# Patient Record
Sex: Male | Born: 1960 | Race: White | Hispanic: No | Smoking: Never smoker
Health system: Southern US, Community
[De-identification: ages and names within clinical notes are randomized; demographics above are authoritative.]

## PROBLEM LIST (undated history)

## (undated) DIAGNOSIS — L853 Xerosis cutis: Secondary | ICD-10-CM

## (undated) HISTORY — PX: OTHER SURGICAL HISTORY: SHX169

## (undated) HISTORY — PX: ANKLE SURGERY: SHX546

## (undated) HISTORY — PX: TONSILLECTOMY: SUR1361

---

## 2009-07-14 ENCOUNTER — Ambulatory Visit: Payer: Self-pay | Admitting: Family Medicine

## 2009-07-14 DIAGNOSIS — J019 Acute sinusitis, unspecified: Secondary | ICD-10-CM

## 2009-07-14 LAB — CONVERTED CEMR LAB: Rapid Strep: NEGATIVE

## 2010-04-20 ENCOUNTER — Ambulatory Visit: Payer: Self-pay | Admitting: Emergency Medicine

## 2010-04-20 DIAGNOSIS — L259 Unspecified contact dermatitis, unspecified cause: Secondary | ICD-10-CM | POA: Insufficient documentation

## 2010-04-29 ENCOUNTER — Telehealth (INDEPENDENT_AMBULATORY_CARE_PROVIDER_SITE_OTHER): Payer: Self-pay | Admitting: *Deleted

## 2010-09-24 NOTE — Assessment & Plan Note (Signed)
Summary: RASH ON FOOT/TM   Vital Signs:  Patient Profile:   50 Years Old Male CC:      Rash on top of right foot x 2 months Height:     72 inches Weight:      198 pounds O2 Sat:      98 % O2 treatment:    Room Air Temp:     97.5 degrees F oral Pulse rate:   46 / minute Pulse rhythm:   regular Resp:     16 per minute BP sitting:   118 / 79  (left arm) Cuff size:   regular  Vitals Entered By: Emilio Math (April 20, 2010 10:34 AM)                  Current Allergies (reviewed today): ! PENICILLINHistory of Present Illness Chief Complaint: Rash on top of right foot x 2 months History of Present Illness: Rash on top of R foot x2 months. However, is getting worse recently. Itchy.  He thinks it began when he was mowing his yard in tennis shoes without socks.  Became red.  Since then has come and gone, itchy, red, now with spots and irritation.  He tried to scrub it and use antifungal spray, but he thinks that's what made it worse.  Current Meds ADVIL 200 MG TABS (IBUPROFEN) As directed ZYRTEC-D ALLERGY & CONGESTION 5-120 MG XR12H-TAB (CETIRIZINE-PSEUDOEPHEDRINE)  CLOBETASOL PROPIONATE 0.05 % CREA (CLOBETASOL PROPIONATE) apply to affected area two times a day  REVIEW OF SYSTEMS Constitutional Symptoms      Denies fever, chills, night sweats, weight loss, weight gain, and fatigue.  Eyes       Denies change in vision, eye pain, eye discharge, glasses, contact lenses, and eye surgery. Ear/Nose/Throat/Mouth       Denies hearing loss/aids, change in hearing, ear pain, ear discharge, dizziness, frequent runny nose, frequent nose bleeds, sinus problems, sore throat, hoarseness, and tooth pain or bleeding.  Respiratory       Denies dry cough, productive cough, wheezing, shortness of breath, asthma, bronchitis, and emphysema/COPD.  Cardiovascular       Denies murmurs, chest pain, and tires easily with exhertion.    Gastrointestinal       Denies stomach pain, nausea/vomiting,  diarrhea, constipation, blood in bowel movements, and indigestion. Genitourniary       Denies painful urination, kidney stones, and loss of urinary control. Neurological       Denies paralysis, seizures, and fainting/blackouts. Musculoskeletal       Denies muscle pain, joint pain, joint stiffness, decreased range of motion, redness, swelling, muscle weakness, and gout.  Skin       Denies bruising, unusual mles/lumps or sores, and hair/skin or nail changes.  Psych       Denies mood changes, temper/anger issues, anxiety/stress, speech problems, depression, and sleep problems.  Past History:  Past Medical History: Reviewed history from 07/14/2009 and no changes required. Unremarkable  Past Surgical History: Reviewed history from 07/14/2009 and no changes required. Tonsillectomy Broken ankle - Rod inserted 1996                        Rod removed 1998  Family History: Reviewed history from 07/14/2009 and no changes required. Family History of Prostate CA 1st degree relative <50 - Father Family History of Cardiovascular disorder MI - Grandfathers and Uncles  Social History: Reviewed history from 07/14/2009 and no changes required. Single Never Smoked Alcohol use-no Drug use-no  Regular exercise-yes Truck driver Physical Exam General appearance: well developed, well nourished, no acute distress Dorsum of R foot, area 2x3cm with lesion c/w contact derm with irritation, redness, dryness. There are small spots almost petechiae-like, but seems to be more chronic irritation. Assessment New Problems: CONTACT DERMATITIS (ICD-692.9)  Contact derm, likely from repeated exposure (poison ivy oils in his shoe?)  Patient Education: Patient and/or caregiver instructed in the following: fluids.  Plan New Medications/Changes: CLOBETASOL PROPIONATE 0.05 % CREA (CLOBETASOL PROPIONATE) apply to affected area two times a day  #100g x 0, 04/20/2010, Hoyt Koch MD  New Orders: Est.  Patient Level II [phone] Planning Comments:   Don't scratch or rub on foot and let it heal up.  Use cream as directed.  If not improving in 1-2 weeks, will need to f/u with a dermatologist.   The patient and/or caregiver has been counseled thoroughly with regard to medications prescribed including dosage, schedule, interactions, rationale for use, and possible side effects and they verbalize understanding.  Diagnoses and expected course of recovery discussed and will return if not improved as expected or if the condition worsens. Patient and/or caregiver verbalized understanding.  Prescriptions: CLOBETASOL PROPIONATE 0.05 % CREA (CLOBETASOL PROPIONATE) apply to affected area two times a day  #100g x 0   Entered and Authorized by:   Hoyt Koch MD   Signed by:   Hoyt Koch MD on 04/20/2010   Method used:   Print then Give to Patient   RxID:   [id number]   Orders Added: 1)  Est. Patient Level II [95621]

## 2010-09-24 NOTE — Progress Notes (Signed)
  Phone Note Outgoing Call Call back at [facility] Phone [phone]   Call placed by: Lajean Saver RN,  April 29, 2010 4:54 PM Call placed to: Patient Action Taken: Phone Call Completed Summary of Call: Call back: Patient states rash is improving and he is feeling well. Reminded if rash gets worseor returns to see a Armed forces operational officer.

## 2011-07-05 ENCOUNTER — Emergency Department
Admission: EM | Admit: 2011-07-05 | Discharge: 2011-07-05 | Disposition: A | Discharge status: Home / Self Care | Payer: BC Managed Care – PPO | Source: Home / Self Care | Attending: Family Medicine | Admitting: Family Medicine

## 2011-07-05 ENCOUNTER — Encounter: Payer: Self-pay | Admitting: Emergency Medicine

## 2011-07-05 DIAGNOSIS — L851 Acquired keratosis [keratoderma] palmaris et plantaris: Secondary | ICD-10-CM

## 2011-07-05 DIAGNOSIS — L858 Other specified epidermal thickening: Secondary | ICD-10-CM

## 2011-07-05 HISTORY — DX: Xerosis cutis: L85.3

## 2011-07-05 MED ORDER — TRIAMCINOLONE ACETONIDE 0.1 % EX CREA: TOPICAL_CREAM | Freq: Two times a day (BID) | CUTANEOUS | Status: AC

## 2011-07-05 NOTE — ED Notes (Signed)
Rash on extremeties x 2-3 weeks. Similar to dry skin rash in past but not responding to Amylactic cream.

## 2011-07-08 NOTE — ED Provider Notes (Signed)
History     CSN: [id number] Arrival date & time: 07/05/2011 10:26 AM   First MD Initiated Contact with Patient 07/05/11 1026      Chief Complaint  Patient presents with  . Rash    2-3 weeks; similar to previous dry skin rashes but not responding to OTC      HPI Comments: Patient has a history of a recurring rough rash on his upper arms that usually occurs in dry weather and change of seasons.  The rash has responded in the past to lactic acid cream.  Over the past week or two the rash on upper arms/shoulders has improved with the lactic acid cream, but he has now developed mild erythema and itching on his elbows.  Patient is a 50 y.o. male presenting with rash. The history is provided by the patient.  Rash  This is a recurrent problem. The current episode started more than 1 week ago. The problem has been gradually worsening. Associated with: Dry air and change of seasons. There has been no fever. The rash is present on the left arm and right arm. Associated symptoms include itching. Pertinent negatives include no blisters, no pain and no weeping. Treatments tried: Lactic acid cream. The treatment provided mild relief.    Past Medical History  Diagnosis Date  . Dry skin dermatitis     Past Surgical History  Procedure Date  . Tonsillectomy   . Broken right ankle rod     Family History  Problem Relation Age of Onset  . Hypertension Father   . Hyperlipidemia Father     History  Substance Use Topics  . Smoking status: Not on file  . Smokeless tobacco: Not on file  . Alcohol Use: No      Review of Systems  Constitutional: Negative.   HENT: Negative.   Eyes: Negative.   Respiratory: Negative.   Cardiovascular: Negative.   Gastrointestinal: Negative.   Genitourinary: Negative.   Musculoskeletal: Negative.   Skin: Positive for itching and rash.  Neurological: Negative.   Hematological: Negative.     Allergies  Penicillins  Home Medications   Current  Outpatient Rx  Name Route Sig Dispense Refill  . AMMONIUM LACTATE 12 % EX CREA Topical Apply topically as needed.      . TRIAMCINOLONE ACETONIDE 0.1 % EX CREA Topical Apply topically 2 (two) times daily. 30 g 1    BP 118/78  Pulse 46  Temp(Src) 98.9 F (37.2 C) (Oral)  Resp 16  Ht 6' (1.829 m)  Wt 189 lb (85.73 kg)  BMI 25.63 kg/m2  SpO2 99%  Physical Exam  Nursing note and vitals reviewed. Constitutional: He is oriented to person, place, and time. He appears well-developed and well-nourished. No distress.  HENT:  Head: Normocephalic and atraumatic.  Mouth/Throat: Oropharynx is clear and moist.  Eyes: Conjunctivae are normal. Pupils are equal, round, and reactive to light.  Neck: Neck supple.  Cardiovascular: Regular rhythm and normal heart sounds.   Pulmonary/Chest: Effort normal and breath sounds normal. No respiratory distress. He has no wheezes. He has no rales.  Abdominal: Soft. There is no tenderness.  Musculoskeletal: He exhibits no edema.  Lymphadenopathy:    He has no cervical adenopathy.  Neurological: He is alert and oriented to person, place, and time.  Skin: Skin is warm and dry. Rash noted. Rash is macular. Rash is not papular.          Bilateral antecubital fossae reveal mild macular erythema without swelling or erythema.  Upper arms over deltoid areas reveal follicular plugging without erythema, and skin has a "sandpaper" texture.  Psychiatric: He has a normal mood and affect.    ED Course  Procedures None      1. Keratosis pilaris, acquired       MDM  Begin Kenalog cream to antecubital fossae bid for about 5 to 7 days until erythema and irritation resolved, then discontinue. May continue ammonium lactate cream Discussed proper skin care:  Minimize baths/showers.  Use a mild bath soap containing oil such as unscented Dove.  Apply a moisturizing cream or lotion immediately after bathing while still wet, then towel dry.  Given [facility]  information and instructions on topic Keratosis Pilaris Follow-up with dermatologist 2 weeks if not improving or if symptoms become worse.            Donna Christen, MD 07/08/11 [phone]

## 2013-01-01 ENCOUNTER — Emergency Department
Admission: EM | Admit: 2013-01-01 | Discharge: 2013-01-01 | Disposition: A | Discharge status: Home / Self Care | Payer: BC Managed Care – PPO | Source: Home / Self Care | Attending: Family Medicine | Admitting: Family Medicine

## 2013-01-01 ENCOUNTER — Ambulatory Visit (HOSPITAL_BASED_OUTPATIENT_CLINIC_OR_DEPARTMENT_OTHER)
Admit: 2013-01-01 | Discharge: 2013-01-01 | Disposition: A | Discharge status: Home / Self Care | Payer: BC Managed Care – PPO | Attending: Family Medicine | Admitting: Family Medicine

## 2013-01-01 ENCOUNTER — Encounter: Payer: Self-pay | Admitting: Emergency Medicine

## 2013-01-01 DIAGNOSIS — R319 Hematuria, unspecified: Secondary | ICD-10-CM | POA: Insufficient documentation

## 2013-01-01 DIAGNOSIS — R109 Unspecified abdominal pain: Secondary | ICD-10-CM

## 2013-01-01 LAB — POCT URINALYSIS DIP (MANUAL ENTRY)
Bilirubin, UA: NEGATIVE
Glucose, UA: NEGATIVE
Ketones, POC UA: NEGATIVE
Nitrite, UA: NEGATIVE
Spec Grav, UA: 1.01 (ref 1.005–1.03)

## 2013-01-01 MED ORDER — MELOXICAM 15 MG PO TABS: 15.0000 mg | ORAL_TABLET | Freq: Every day | ORAL | Status: DC

## 2013-01-01 NOTE — ED Notes (Signed)
Reports abdominal pain x3-4 days.

## 2013-01-02 LAB — URINE CULTURE: Organism ID, Bacteria: NO GROWTH

## 2013-01-03 ENCOUNTER — Telehealth: Payer: Self-pay | Admitting: *Deleted

## 2013-01-05 NOTE — ED Provider Notes (Signed)
History     CSN: [id number]  Arrival date & time 01/01/13  1704   First MD Initiated Contact with Patient 01/01/13 1800      Chief Complaint  Patient presents with  . Abdominal Pain   HPI  R groin pain x 3-4 days.  Mild pain that radiated around to groin.  Mild pain with movement.  No hematuria.  Mild polyuria.  No dysuria.  No prior hx/o kidney stones.  Unsure of any recent strenuous activity.    Past Medical History  Diagnosis Date  . Dry skin dermatitis     Past Surgical History  Procedure Laterality Date  . Tonsillectomy    . Broken right ankle rod      Family History  Problem Relation Age of Onset  . Hypertension Father   . Hyperlipidemia Father     History  Substance Use Topics  . Smoking status: Not on file  . Smokeless tobacco: Not on file  . Alcohol Use: No      Review of Systems  All other systems reviewed and are negative.    Allergies  Penicillins  Home Medications   Current Outpatient Rx  Name  Route  Sig  Dispense  Refill  . ammonium lactate (AMLACTIN) 12 % cream   Topical   Apply topically as needed.           . meloxicam (MOBIC) 15 MG tablet   Oral   Take 1 tablet (15 mg total) by mouth daily.   30 tablet   1     BP 132/88  Pulse 69  Temp(Src) 98.1 F (36.7 C) (Oral)  Resp 16  Ht 6' (1.829 m)  Wt 200 lb (90.719 kg)  BMI 27.12 kg/m2  SpO2 100%  Physical Exam  Constitutional: He is oriented to person, place, and time. He appears well-developed and well-nourished.  HENT:  Head: Normocephalic and atraumatic.  Eyes: Conjunctivae are normal. Pupils are equal, round, and reactive to light.  Neck: Normal range of motion. Neck supple.  Cardiovascular: Normal rate, regular rhythm and normal heart sounds.   Pulmonary/Chest: Effort normal.  Abdominal: Soft. Bowel sounds are normal.  Mild R sided flank pain  No suprapubic tenderness   Genitourinary:  No hernia  Musculoskeletal: Normal range of motion.   Neurological: He is alert and oriented to person, place, and time.  Skin: Skin is warm.    ED Course  Procedures (including critical care time)  Labs Reviewed  URINE CULTURE   Narrative:    Performed at:  [facility]                [address], [address]                [place], [place] [address]  POCT URINALYSIS DIP (MANUAL ENTRY)   ABDOMEN - 1 VIEW  Comparison: None  Findings:  Small pelvic phleboliths.  Normal bowel gas pattern.  No definite urinary tract calcification identified.  Osseous structures unremarkable.  IMPRESSION:  No definite urinary tract calcification identified.    1. Right flank pain       MDM  UA negative for hematuria No kidney stones on KUB.  Will check urine culture.  ? MSK component.  Will place on mobic for symptomatic management pending urine culture.  Discussed MSK and GU red flags Follow up as needed.     The patient and/or caregiver has been counseled thoroughly with regard to treatment plan and/or medications prescribed including dosage,  schedule, interactions, rationale for use, and possible side effects and they verbalize understanding. Diagnoses and expected course of recovery discussed and will return if not improved as expected or if the condition worsens. Patient and/or caregiver verbalized understanding.             Doree Albee, MD 01/05/13 2108

## 2014-01-30 ENCOUNTER — Encounter (HOSPITAL_BASED_OUTPATIENT_CLINIC_OR_DEPARTMENT_OTHER): Payer: Self-pay | Admitting: Emergency Medicine

## 2014-01-30 ENCOUNTER — Emergency Department (HOSPITAL_BASED_OUTPATIENT_CLINIC_OR_DEPARTMENT_OTHER)
Admission: EM | Admit: 2014-01-30 | Discharge: 2014-01-31 | Disposition: A | Discharge status: Home / Self Care | Payer: Worker's Compensation | Attending: Emergency Medicine | Admitting: Emergency Medicine

## 2014-01-30 ENCOUNTER — Emergency Department (HOSPITAL_BASED_OUTPATIENT_CLINIC_OR_DEPARTMENT_OTHER): Payer: Worker's Compensation

## 2014-01-30 DIAGNOSIS — Z872 Personal history of diseases of the skin and subcutaneous tissue: Secondary | ICD-10-CM | POA: Insufficient documentation

## 2014-01-30 DIAGNOSIS — Z791 Long term (current) use of non-steroidal anti-inflammatories (NSAID): Secondary | ICD-10-CM | POA: Insufficient documentation

## 2014-01-30 DIAGNOSIS — S5002XA Contusion of left elbow, initial encounter: Secondary | ICD-10-CM

## 2014-01-30 DIAGNOSIS — Y929 Unspecified place or not applicable: Secondary | ICD-10-CM | POA: Insufficient documentation

## 2014-01-30 DIAGNOSIS — W010XXA Fall on same level from slipping, tripping and stumbling without subsequent striking against object, initial encounter: Secondary | ICD-10-CM | POA: Insufficient documentation

## 2014-01-30 DIAGNOSIS — S51009A Unspecified open wound of unspecified elbow, initial encounter: Secondary | ICD-10-CM | POA: Insufficient documentation

## 2014-01-30 DIAGNOSIS — S51012A Laceration without foreign body of left elbow, initial encounter: Secondary | ICD-10-CM

## 2014-01-30 DIAGNOSIS — Z23 Encounter for immunization: Secondary | ICD-10-CM | POA: Insufficient documentation

## 2014-01-30 DIAGNOSIS — Y9389 Activity, other specified: Secondary | ICD-10-CM | POA: Insufficient documentation

## 2014-01-30 DIAGNOSIS — S5000XA Contusion of unspecified elbow, initial encounter: Secondary | ICD-10-CM | POA: Insufficient documentation

## 2014-01-30 DIAGNOSIS — Z880 Allergy status to penicillin: Secondary | ICD-10-CM | POA: Insufficient documentation

## 2014-01-30 MED ORDER — IBUPROFEN 800 MG PO TABS
800.0000 mg | ORAL_TABLET | Freq: Once | ORAL | Status: AC
  Administered 2014-01-30: 800 mg via ORAL
  Filled 2014-01-30: qty 1

## 2014-01-30 MED ORDER — BACITRACIN 500 UNIT/GM EX OINT
1.0000 "application " | TOPICAL_OINTMENT | Freq: Two times a day (BID) | CUTANEOUS | Status: DC
  Administered 2014-01-30: 1 via TOPICAL
  Filled 2014-01-30: qty 0.9

## 2014-01-30 MED ORDER — LIDOCAINE HCL (PF) 1 % IJ SOLN: 5.0000 mL | Freq: Once | INTRAMUSCULAR | Status: DC

## 2014-01-30 MED ORDER — TETANUS-DIPHTH-ACELL PERTUSSIS 5-2.5-18.5 LF-MCG/0.5 IM SUSP
0.5000 mL | Freq: Once | INTRAMUSCULAR | Status: AC
  Administered 2014-01-30: 0.5 mL via INTRAMUSCULAR
  Filled 2014-01-30: qty 0.5

## 2014-01-30 NOTE — ED Notes (Signed)
Left elbow injury. Laceration and blunt trauma. Was getting out of his [employer] truck in the rain and slipped on a wet step.

## 2014-01-30 NOTE — Discharge Instructions (Signed)
Return for any redness, fever, increased pain or signs of infection.  Contusion A contusion is a deep bruise. Contusions are the result of an injury that caused bleeding under the skin. The contusion may turn blue, purple, or yellow. Minor injuries will give you a painless contusion, but more severe contusions may stay painful and swollen for a few weeks.  CAUSES  A contusion is usually caused by a blow, trauma, or direct force to an area of the body. SYMPTOMS   Swelling and redness of the injured area.  Bruising of the injured area.  Tenderness and soreness of the injured area.  Pain. DIAGNOSIS  The diagnosis can be made by taking a history and physical exam. An X-ray, CT scan, or MRI may be needed to determine if there were any associated injuries, such as fractures. TREATMENT  Specific treatment will depend on what area of the body was injured. In general, the best treatment for a contusion is resting, icing, elevating, and applying cold compresses to the injured area. Over-the-counter medicines may also be recommended for pain control. Ask your caregiver what the best treatment is for your contusion. HOME CARE INSTRUCTIONS   Put ice on the injured area.  Put ice in a plastic bag.  Place a towel between your skin and the bag.  Leave the ice on for 15-20 minutes, 03-04 times a day.  Only take over-the-counter or prescription medicines for pain, discomfort, or fever as directed by your caregiver. Your caregiver may recommend avoiding anti-inflammatory medicines (aspirin, ibuprofen, and naproxen) for 48 hours because these medicines may increase bruising.  Rest the injured area.  If possible, elevate the injured area to reduce swelling. SEEK IMMEDIATE MEDICAL CARE IF:   You have increased bruising or swelling.  You have pain that is getting worse.  Your swelling or pain is not relieved with medicines. MAKE SURE YOU:   Understand these instructions.  Will watch your  condition.  Will get help right away if you are not doing well or get worse. Document Released: 05/21/2005 Document Revised: 11/03/2011 Document Reviewed: 06/16/2011 [facility] Patient Information 2014 [place], [place].

## 2014-01-30 NOTE — ED Provider Notes (Signed)
CSN: [id number]     Arrival date & time 01/30/14  2038 History   First MD Initiated Contact with Patient 01/30/14 2207     Chief Complaint  Patient presents with  . Elbow Injury     (Consider location/radiation/quality/duration/timing/severity/associated sxs/prior Treatment) Patient is a 53 y.o. male presenting with arm injury. The history is provided by the patient.  Arm Injury Location:  Elbow Time since incident:  1 hour Injury: yes   Mechanism of injury: fall   Fall:    Impact surface:  Concrete   Entrapped after fall: no   Elbow location:  L elbow Pain details:    Quality:  Throbbing   Radiates to:  Does not radiate   Severity:  Moderate   Onset quality:  Sudden   Timing:  Constant   Progression:  Unchanged Chronicity:  New Dislocation: no   Foreign body present:  Unable to specify Tetanus status:  Out of date Prior injury to area:  No Relieved by:  None tried Worsened by:  Nothing tried Ineffective treatments:  None tried  Larry Prince is a 53 y.o. male who presents to the ED with left elbow pain. He was getting out of his truck and it was raining. His foot slipped and he fell out of the truck onto the pavement. He hit on his buttocks and his left elbow. He denies pain at this time in the buttocks but does have pain in the left elbow and laceration. He denies any other injuries.   Past Medical History  Diagnosis Date  . Dry skin dermatitis    Past Surgical History  Procedure Laterality Date  . Tonsillectomy    . Broken right ankle rod     Family History  Problem Relation Age of Onset  . Hypertension Father   . Hyperlipidemia Father    History  Substance Use Topics  . Smoking status: Never Smoker   . Smokeless tobacco: Not on file  . Alcohol Use: No    Review of Systems Negative except as stated in HPI   Allergies  Penicillins  Home Medications   Prior to Admission medications   Medication Sig Start Date End Date Taking? Authorizing Provider    ammonium lactate (AMLACTIN) 12 % cream Apply topically as needed.      Historical Provider, MD  meloxicam (MOBIC) 15 MG tablet Take 1 tablet (15 mg total) by mouth daily. 01/01/13   Doree AlbeeSteven Newton, MD   BP 123/92  Pulse 56  Temp(Src) 97.6 F (36.4 C) (Oral)  Resp 18  Ht 6' (1.829 m)  Wt 197 lb (89.359 kg)  BMI 26.71 kg/m2  SpO2 100% Physical Exam  Nursing note and vitals reviewed. Constitutional: He is oriented to person, place, and time. He appears well-developed and well-nourished. No distress.  HENT:  Head: Normocephalic.  Eyes: EOM are normal.  Neck: Normal range of motion. Neck supple.  Cardiovascular: Normal rate.   Pulmonary/Chest: Effort normal.  Musculoskeletal: Normal range of motion.       Left elbow: He exhibits swelling and laceration. He exhibits normal range of motion and no deformity. Tenderness found. Radial head tenderness noted.       Arms: There is a flap laceration to the left elbow. Bleeding controlled.   Neurological: He is alert and oriented to person, place, and time. No cranial nerve deficit.  Skin: Skin is warm and dry.  Psychiatric: He has a normal mood and affect. His behavior is normal.    ED Course  Procedures (including critical care time) LACERATION REPAIR Performed by: Larry Prince Authorized by: Larry Prince Consent: Verbal consent obtained. Risks and benefits: risks, benefits and alternatives were discussed Consent given by: patient Patient identity confirmed: provided demographic data Prepped and Draped in normal sterile fashion Wound explored  Laceration Location: left elbow  Laceration Length: 1.5 cm  Cleaned with betadine and irrigated with NSS  No Foreign Bodies seen or palpated  Anesthesia: local infiltration  Local anesthetic: lidocaine 2% without epinephrine  Anesthetic total: 2 ml  Irrigation method: syringe Amount of cleaning: standard  Skin closure: 5-0 prolene  Number of sutures: 3  Technique:  interrupted  Patient tolerance: Patient tolerated the procedure well with no immediate complications.   Imaging Review Dg Elbow Complete Left  01/30/2014   CLINICAL DATA:  Elbow injury. Pain and laceration. Fall from truck.  EXAM: LEFT ELBOW - COMPLETE 3+ VIEW  COMPARISON:  None.  FINDINGS: There is no evidence of fracture, dislocation, or joint effusion. There is no evidence of arthropathy or other focal bone abnormality. Soft tissues are unremarkable.  IMPRESSION: Negative.   Electronically Signed   By: Britta Mccreedy M.D.   On: 01/30/2014 21:21   MDM  53 y.o. male with laceration and contusion to the left elbow s/p fall. I have reviewed this patient's vital signs, nurses notes, appropriate labs and imaging.  I have discussed findings and plan of care with the patient and he voices understanding. He will follow up in one week for suture removal. He will return sooner for any problems. 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 He will take ibuprofen as needed. He will apply ice to the area and elevate.     [address] [place], [place] 01/31/14 [phone]

## 2014-02-02 NOTE — ED Provider Notes (Signed)
Medical screening examination/treatment/procedure(s) were performed by non-physician practitioner and as supervising physician I was immediately available for consultation/collaboration.   EKG Interpretation None        Kristen N Ward, DO 02/02/14 0804 

## 2014-02-06 ENCOUNTER — Emergency Department (INDEPENDENT_AMBULATORY_CARE_PROVIDER_SITE_OTHER)
Admission: EM | Admit: 2014-02-06 | Discharge: 2014-02-06 | Disposition: A | Discharge status: Home / Self Care | Payer: Worker's Compensation | Source: Home / Self Care

## 2014-02-06 ENCOUNTER — Encounter: Payer: Self-pay | Admitting: Emergency Medicine

## 2014-02-06 DIAGNOSIS — Z4802 Encounter for removal of sutures: Secondary | ICD-10-CM

## 2014-02-06 NOTE — ED Notes (Signed)
Removed 2 sutures

## 2014-02-07 NOTE — Discharge Instructions (Signed)
Suture Removal, Care After Refer to this sheet in the next few weeks. These instructions provide you with information on caring for yourself after your procedure. Your health care provider may also give you more specific instructions. Your treatment has been planned according to current medical practices, but problems sometimes occur. Call your health care provider if you have any problems or questions after your procedure. WHAT TO EXPECT AFTER THE PROCEDURE After your stitches (sutures) are removed, it is typical to have the following:  Some discomfort and swelling in the wound area.  Slight redness in the area. HOME CARE INSTRUCTIONS   If you have skin adhesive strips over the wound area, do not take the strips off. They will fall off on their own in a few days. If the strips remain in place after 14 days, you may remove them.  Change any bandages (dressings) at least once a day or as directed by your health care provider. If the bandage sticks, soak it off with warm, soapy water.  Apply cream or ointment only as directed by your health care provider. If using cream or ointment, wash the area with soap and water 2 times a day to remove all the cream or ointment. Rinse off the soap and pat the area dry with a clean towel.  Keep the wound area dry and clean. If the bandage becomes wet or dirty, or if it develops a bad smell, change it as soon as possible.  Continue to protect the wound from injury.  Use sunscreen when out in the sun. New scars become sunburned easily. SEEK MEDICAL CARE IF:  You have increasing redness, swelling, or pain in the wound.  You see pus coming from the wound.  You have a fever.  You notice a bad smell coming from the wound or dressing.  Your wound breaks open (edges not staying together). Document Released: 05/06/2001 Document Revised: 06/01/2013 Document Reviewed: 03/23/2013 [employer] Patient Information 2014 [employer].  

## 2014-02-07 NOTE — ED Provider Notes (Signed)
CSN: [id number]     Arrival date & time 02/06/14  1959 History   None    Chief Complaint  Patient presents with  . Suture / Staple Removal      HPI Comments: Patient returns for suture removal from laceration left elbow, repaired one week ago.  He reports that one suture came out spontaneously.  He denies pain, drainage, or swelling.  The history is provided by the patient.    Past Medical History  Diagnosis Date  . Dry skin dermatitis    Past Surgical History  Procedure Laterality Date  . Tonsillectomy    . Broken right ankle rod     Family History  Problem Relation Age of Onset  . Hypertension Father   . Hyperlipidemia Father    History  Substance Use Topics  . Smoking status: Never Smoker   . Smokeless tobacco: Not on file  . Alcohol Use: No    Review of Systems Denies pain or drainage at suture site.  No fevers, chills, and sweats  Allergies  Penicillins  Home Medications   Prior to Admission medications   Medication Sig Start Date End Date Taking? Authorizing Kasidi Shanker  ammonium lactate (AMLACTIN) 12 % cream Apply topically as needed.      Historical Nilza Eaker, MD  meloxicam (MOBIC) 15 MG tablet Take 1 tablet (15 mg total) by mouth daily. 01/01/13   Doree AlbeeSteven Newton, MD   BP 110/80  Pulse 56  Temp(Src) 97.8 F (36.6 C) (Oral)  Ht 6' (1.829 m)  Wt 197 lb (89.359 kg)  BMI 26.71 kg/m2  SpO2 97% Physical Exam Nursing notes and Vital Signs reviewed. Appearance:  Patient appears healthy, stated age, and in no acute distress Skin:   Left posterior elbow has well healed laceration with two sutures in place.  No erythema, swelling, drainage, or tenderness.  ED Course  Procedures  none      MDM   1. Visit for suture removal;  Well healed laceration without evidence infection    Sutures removed.  Bacitracin/bandage applied.    Lattie HawStephen A Beese, MD 02/07/14 [phone]

## 2015-04-28 ENCOUNTER — Emergency Department (HOSPITAL_BASED_OUTPATIENT_CLINIC_OR_DEPARTMENT_OTHER)
Admission: EM | Admit: 2015-04-28 | Discharge: 2015-04-28 | Disposition: A | Discharge status: Home / Self Care | Payer: Worker's Compensation | Attending: Emergency Medicine | Admitting: Emergency Medicine

## 2015-04-28 ENCOUNTER — Encounter (HOSPITAL_BASED_OUTPATIENT_CLINIC_OR_DEPARTMENT_OTHER): Payer: Self-pay | Admitting: *Deleted

## 2015-04-28 ENCOUNTER — Emergency Department (HOSPITAL_BASED_OUTPATIENT_CLINIC_OR_DEPARTMENT_OTHER): Payer: Worker's Compensation

## 2015-04-28 DIAGNOSIS — S0993XA Unspecified injury of face, initial encounter: Secondary | ICD-10-CM | POA: Insufficient documentation

## 2015-04-28 DIAGNOSIS — Y9289 Other specified places as the place of occurrence of the external cause: Secondary | ICD-10-CM | POA: Diagnosis not present

## 2015-04-28 DIAGNOSIS — S40812A Abrasion of left upper arm, initial encounter: Secondary | ICD-10-CM | POA: Diagnosis not present

## 2015-04-28 DIAGNOSIS — Y998 Other external cause status: Secondary | ICD-10-CM | POA: Diagnosis not present

## 2015-04-28 DIAGNOSIS — Y9389 Activity, other specified: Secondary | ICD-10-CM | POA: Diagnosis not present

## 2015-04-28 DIAGNOSIS — S29001A Unspecified injury of muscle and tendon of front wall of thorax, initial encounter: Secondary | ICD-10-CM | POA: Diagnosis present

## 2015-04-28 DIAGNOSIS — Z79899 Other long term (current) drug therapy: Secondary | ICD-10-CM | POA: Diagnosis not present

## 2015-04-28 DIAGNOSIS — W010XXA Fall on same level from slipping, tripping and stumbling without subsequent striking against object, initial encounter: Secondary | ICD-10-CM | POA: Insufficient documentation

## 2015-04-28 DIAGNOSIS — S2232XA Fracture of one rib, left side, initial encounter for closed fracture: Secondary | ICD-10-CM | POA: Diagnosis not present

## 2015-04-28 DIAGNOSIS — Z880 Allergy status to penicillin: Secondary | ICD-10-CM | POA: Diagnosis not present

## 2015-04-28 MED ORDER — HYDROCODONE-ACETAMINOPHEN 5-325 MG PO TABS: 1.0000 | ORAL_TABLET | ORAL | Status: DC | PRN

## 2015-04-28 MED ORDER — NAPROXEN 500 MG PO TABS: 500.0000 mg | ORAL_TABLET | Freq: Two times a day (BID) | ORAL | Status: DC
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
## 2015-04-28 NOTE — ED Notes (Signed)
UDS completed 

## 2015-04-28 NOTE — Discharge Instructions (Signed)
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
## 2015-05-22 ENCOUNTER — Ambulatory Visit (INDEPENDENT_AMBULATORY_CARE_PROVIDER_SITE_OTHER): Payer: Worker's Compensation

## 2015-05-22 ENCOUNTER — Other Ambulatory Visit: Payer: Self-pay | Admitting: Adult Health

## 2015-05-22 DIAGNOSIS — S2242XA Multiple fractures of ribs, left side, initial encounter for closed fracture: Secondary | ICD-10-CM

## 2015-05-22 DIAGNOSIS — W1789XA Other fall from one level to another, initial encounter: Secondary | ICD-10-CM | POA: Diagnosis not present

## 2015-05-22 DIAGNOSIS — W19XXXA Unspecified fall, initial encounter: Secondary | ICD-10-CM

## 2016-08-25 ENCOUNTER — Encounter: Payer: Self-pay | Admitting: *Deleted

## 2016-08-25 ENCOUNTER — Emergency Department
Admission: EM | Admit: 2016-08-25 | Discharge: 2016-08-25 | Disposition: A | Discharge status: Home / Self Care | Payer: Managed Care, Other (non HMO) | Source: Home / Self Care | Attending: Family Medicine | Admitting: Family Medicine

## 2016-08-25 DIAGNOSIS — B9789 Other viral agents as the cause of diseases classified elsewhere: Secondary | ICD-10-CM | POA: Diagnosis not present

## 2016-08-25 DIAGNOSIS — J3089 Other allergic rhinitis: Secondary | ICD-10-CM

## 2016-08-25 DIAGNOSIS — J069 Acute upper respiratory infection, unspecified: Secondary | ICD-10-CM | POA: Diagnosis not present

## 2016-08-25 MED ORDER — PREDNISONE 20 MG PO TABS: ORAL_TABLET | ORAL | 0 refills | Status: AC

## 2016-08-25 MED ORDER — AZITHROMYCIN 250 MG PO TABS: ORAL_TABLET | ORAL | 0 refills | Status: AC

## 2016-08-25 MED ORDER — BENZONATATE 200 MG PO CAPS: ORAL_CAPSULE | ORAL | 0 refills | Status: AC

## 2016-08-25 NOTE — ED Provider Notes (Signed)
[facility]    CSN: [id number] Arrival date & time: 08/25/16  1040     History   Chief Complaint Chief Complaint  Patient presents with  . Sore Throat  . Nasal Congestion    HPI Larry Prince is a 56 y.o. male.   Patient complains of three day history of typical cold-like symptoms, including mild sore throat, sinus congestion, headache, fatigue, chills, and cough.  He has a history of perennial rhinitis.   The history is provided by the patient.    Past Medical History:  Diagnosis Date  . Dry skin dermatitis     Patient Active Problem List   Diagnosis Date Noted  . CONTACT DERMATITIS 04/20/2010  . ACUTE SINUSITIS, UNSPECIFIED 07/14/2009    Past Surgical History:  Procedure Laterality Date  . ANKLE SURGERY    . ANKLE SURGERY    . broken right ankle rod Bilateral   . TONSILLECTOMY         Home Medications    Prior to Admission medications   Medication Sig Start Date End Date Taking? Authorizing Provider  azithromycin (ZITHROMAX Z-PAK) 250 MG tablet Take 2 tabs today; then begin one tab once daily for 4 more days. (Rx void after 09/02/16) 08/25/16   Lattie HawStephen A Beese, MD  benzonatate (TESSALON) 200 MG capsule Take one cap by mouth at bedtime as needed for cough.  May repeat in 4 to 6 hours 08/25/16   Lattie HawStephen A Beese, MD  predniSONE (DELTASONE) 20 MG tablet Take one tab by mouth twice daily for 5 days, then one daily for 3 days. Take with food. 08/25/16   Lattie HawStephen A Beese, MD    Family History Family History  Problem Relation Age of Onset  . Hypertension Father   . Hyperlipidemia Father   . COPD Father   . AAA (abdominal aortic aneurysm) Father     Social History Social History  Substance Use Topics  . Smoking status: Never Smoker  . Smokeless tobacco: Never Used  . Alcohol use No     Allergies   Penicillins   Review of Systems Review of Systems + sore throat + cough No pleuritic pain No wheezing + nasal congestion + post-nasal  drainage No sinus pain/pressure No itchy/red eyes No earache No hemoptysis No SOB No fever, + chills No nausea No vomiting No abdominal pain No diarrhea No urinary symptoms No skin rash + fatigue No myalgias + headache Used OTC meds without relief   Physical Exam Triage Vital Signs ED Triage Vitals  Enc Vitals Group     BP 08/25/16 1203 149/97     Pulse Rate 08/25/16 1203 69     Resp 08/25/16 1203 18     Temp 08/25/16 1203 98.1 F (36.7 C)     Temp Source 08/25/16 1203 Oral     SpO2 08/25/16 1203 98 %     Weight 08/25/16 1203 207 lb (93.9 kg)     Height 08/25/16 1203 6' (1.829 m)     Head Circumference --      Peak Flow --      Pain Score 08/25/16 1211 0     Pain Loc --      Pain Edu? --      Excl. in GC? --    No data found.   Updated Vital Signs BP 149/97 (BP Location: Left Arm)   Pulse 69   Temp 98.1 F (36.7 C) (Oral)   Resp 18   Ht 6' (1.829  m)   Wt 207 lb (93.9 kg)   SpO2 98%   BMI 28.07 kg/m   Visual Acuity Right Eye Distance:   Left Eye Distance:   Bilateral Distance:    Right Eye Near:   Left Eye Near:    Bilateral Near:     Physical Exam Nursing notes and Vital Signs reviewed. Appearance:  Patient appears stated age, and in no acute distress Eyes:  Pupils are equal, round, and reactive to light and accomodation.  Extraocular movement is intact.  Conjunctivae are not inflamed  Ears:  Canals normal.  Tympanic membranes normal.  Nose:  Mildly congested turbinates.  No sinus tenderness.   Pharynx:  Uvula erythematous Neck:  Supple.  Tender enlarged posterior/lateral nodes are palpated bilaterally  Lungs:  Clear to auscultation.  Breath sounds are equal.  Moving air well. Heart:  Regular rate and rhythm without murmurs, rubs, or gallops.  Abdomen:  Nontender without masses or hepatosplenomegaly.  Bowel sounds are present.  No CVA or flank tenderness.  Extremities:  No edema.  Skin:  No rash present.    UC Treatments / Results   Labs (all labs ordered are listed, but only abnormal results are displayed) Labs Reviewed  POCT RAPID STREP A (OFFICE) negative    EKG  EKG Interpretation None       Radiology No results found.  Procedures Procedures (including critical care time)  Medications Ordered in UC Medications - No data to display   Initial Impression / Assessment and Plan / UC Course  I have reviewed the triage vital signs and the nursing notes.  Pertinent labs & imaging results that were available during my care of the patient were reviewed by me and considered in my medical decision making (see chart for details).  Clinical Course   There is no evidence of bacterial infection today.   With his history of perennial rhinitis and increased sinus congestion, patient would benefit from prednisone burst/taper. Prescription written for Benzonatate [facility]) to take at bedtime for night-time cough.  Take plain guaifenesin (1200mg  extended release tabs such as Mucinex) twice daily, with plenty of water, for cough and congestion.  May add Pseudoephedrine (30mg , one or two every 4 to 6 hours) for sinus congestion.  Get adequate rest.   May use Afrin nasal spray (or generic oxymetazoline) twice daily for about 5 days and then discontinue.  Also recommend using saline nasal spray several times daily and saline nasal irrigation (AYR is a common brand).  Use Flonase nasal spray each morning after using Afrin nasal spray and saline nasal irrigation. Try warm salt water gargles for sore throat.  Stop all antihistamines for now, and other non-prescription cough/cold preparations. Begin Azithromycin if not improving about one week or if persistent fever develops (Given a prescription to hold, with an expiration date)  Follow-up with family doctor if not improving about10 days.      Final Clinical Impressions(s) / UC Diagnoses   Final diagnoses:  Viral URI with cough  Perennial allergic rhinitis    New  Prescriptions New Prescriptions   AZITHROMYCIN (ZITHROMAX Z-PAK) 250 MG TABLET    Take 2 tabs today; then begin one tab once daily for 4 more days. (Rx void after 09/02/16)   BENZONATATE (TESSALON) 200 MG CAPSULE    Take one cap by mouth at bedtime as needed for cough.  May repeat in 4 to 6 hours   PREDNISONE (DELTASONE) 20 MG TABLET    Take one tab by mouth twice  daily for 5 days, then one daily for 3 days. Take with food.     Lattie Haw, MD 09/08/16 [phone]

## 2016-08-25 NOTE — Discharge Instructions (Signed)
Take plain guaifenesin (1200mg extended release tabs such as Mucinex) twice daily, with plenty of water, for cough and congestion.  May add Pseudoephedrine (30mg, one or two every 4 to 6 hours) for sinus congestion.  Get adequate rest.   °May use Afrin nasal spray (or generic oxymetazoline) twice daily for about 5 days and then discontinue.  Also recommend using saline nasal spray several times daily and saline nasal irrigation (AYR is a common brand).  Use Flonase nasal spray each morning after using Afrin nasal spray and saline nasal irrigation. °Try warm salt water gargles for sore throat.  °Stop all antihistamines for now, and other non-prescription cough/cold preparations. °Begin Azithromycin if not improving about one week or if persistent fever develops   °Follow-up with family doctor if not improving about10 days.  °

## 2016-08-25 NOTE — ED Triage Notes (Signed)
Pt c/o productive cough, nasal congestion, sore throat in the morning, and ears "popping" x 2 days.

## 2016-09-09 ENCOUNTER — Telehealth (INDEPENDENT_AMBULATORY_CARE_PROVIDER_SITE_OTHER): Payer: Managed Care, Other (non HMO)

## 2016-09-09 DIAGNOSIS — J029 Acute pharyngitis, unspecified: Secondary | ICD-10-CM | POA: Diagnosis not present

## 2016-09-09 LAB — POCT RAPID STREP A (OFFICE): Rapid Strep A Screen: NEGATIVE
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
## 2023-02-05 ENCOUNTER — Ambulatory Visit (INDEPENDENT_AMBULATORY_CARE_PROVIDER_SITE_OTHER): Payer: Worker's Compensation

## 2023-02-05 ENCOUNTER — Other Ambulatory Visit: Payer: Self-pay | Admitting: Family Medicine

## 2023-02-05 DIAGNOSIS — S6991XA Unspecified injury of right wrist, hand and finger(s), initial encounter: Secondary | ICD-10-CM
